# Patient Record
Sex: Male | Born: 1950 | Race: White | Hispanic: No
Health system: Southern US, Community
[De-identification: ages and names within clinical notes are randomized; demographics above are authoritative.]

---

## 2005-11-27 ENCOUNTER — Inpatient Hospital Stay (HOSPITAL_COMMUNITY): Admission: EM | Admit: 2005-11-27 | Discharge: 2005-11-28 | Payer: Self-pay | Admitting: *Deleted

## 2005-11-27 ENCOUNTER — Ambulatory Visit: Payer: Self-pay | Admitting: *Deleted

## 2006-10-30 ENCOUNTER — Ambulatory Visit: Payer: Self-pay | Admitting: Urology

## 2008-05-01 IMAGING — CT CT ABD-PELV W/O CM
1 of 2 series · 15 of 32 positions shown, 19 images · non-contrast
Comparison: none

REASON FOR EXAM: Renal colic, hematuria
COMMENTS:

[Series 2: stone · axial · 0.81mm/px · z∈[+171,+618]mm · 15 of 167 slices shown, 19 images]
[im 12/167  soft-tissue]
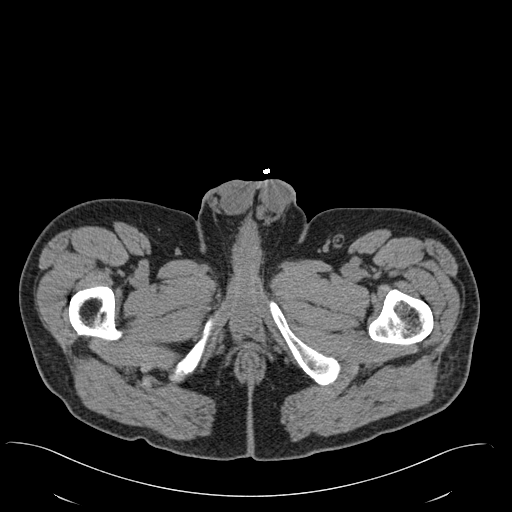
[im 12/167  bone]
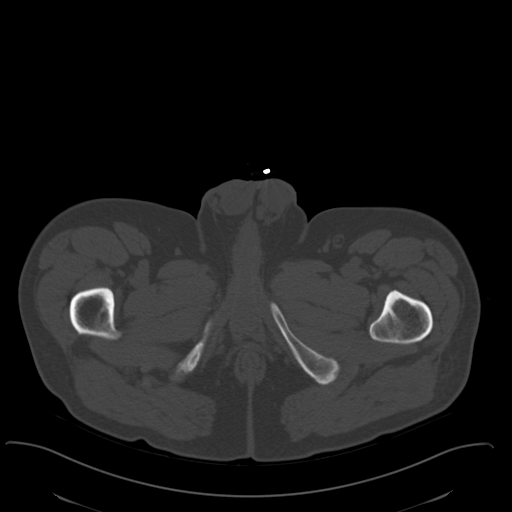
[im 24/167  soft-tissue]
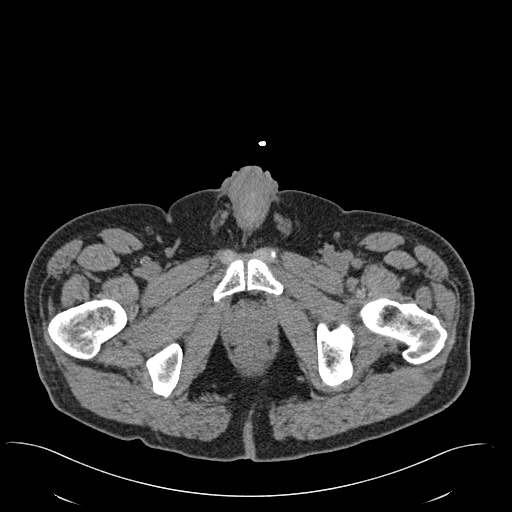
[im 36/167  soft-tissue]
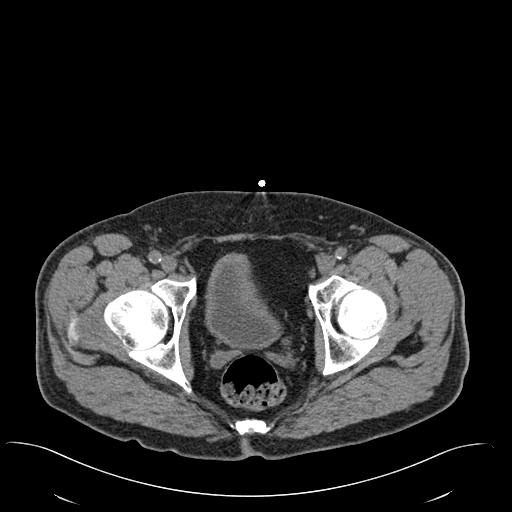
[im 48/167  soft-tissue]
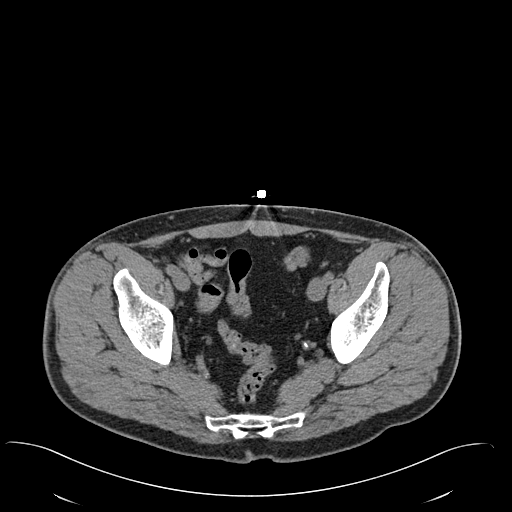
[im 60/167  soft-tissue]
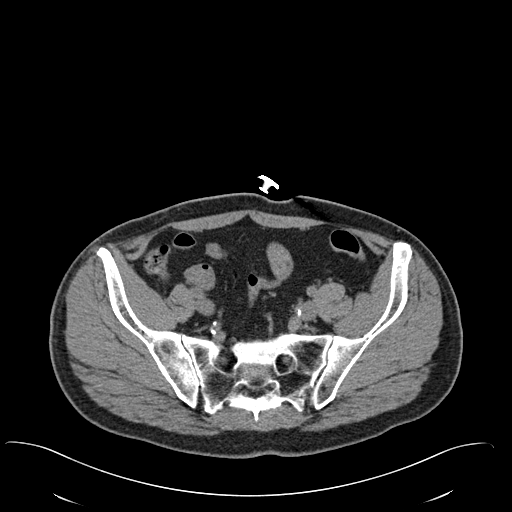
[im 72/167  soft-tissue]
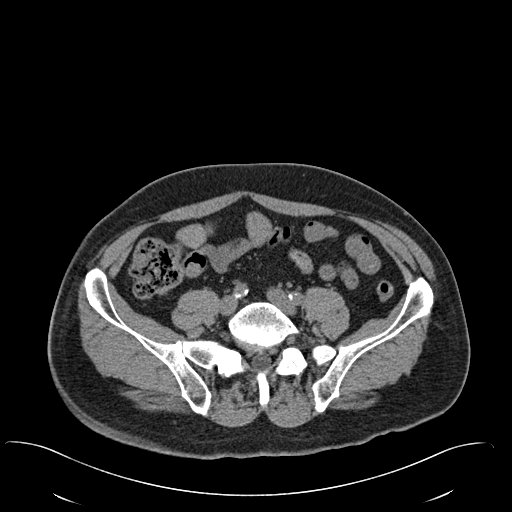
[im 84/167  soft-tissue]
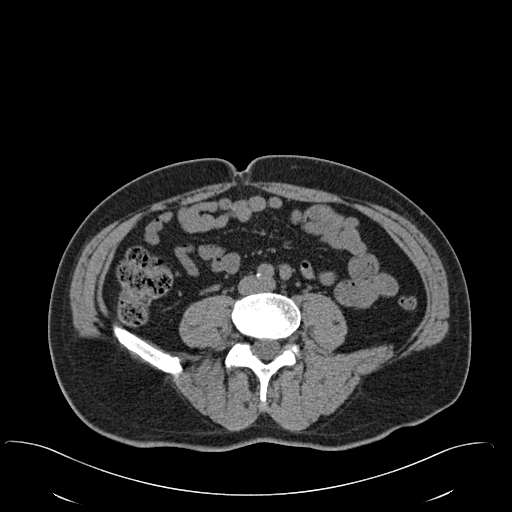
[im 95/167  soft-tissue]
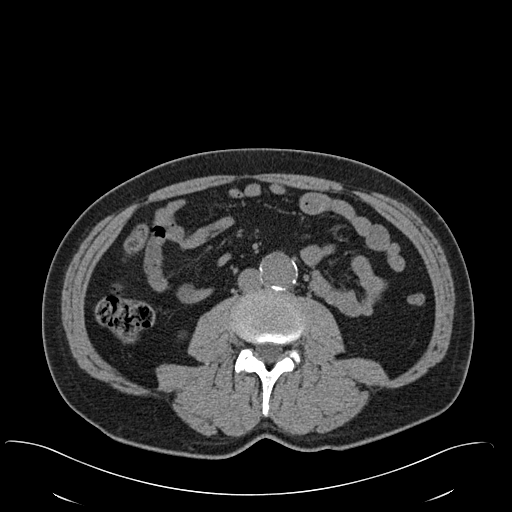
[im 107/167  soft-tissue]
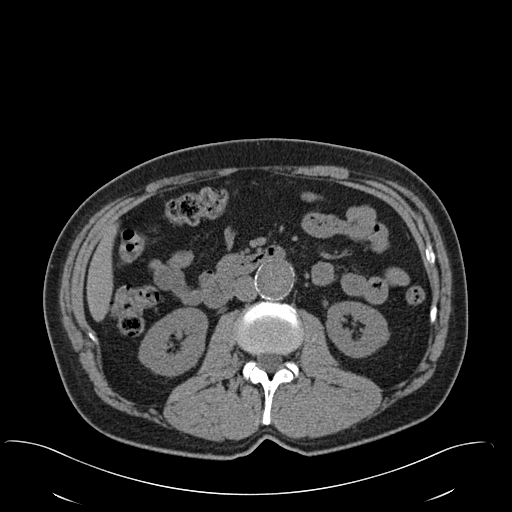
[im 107/167  bone]
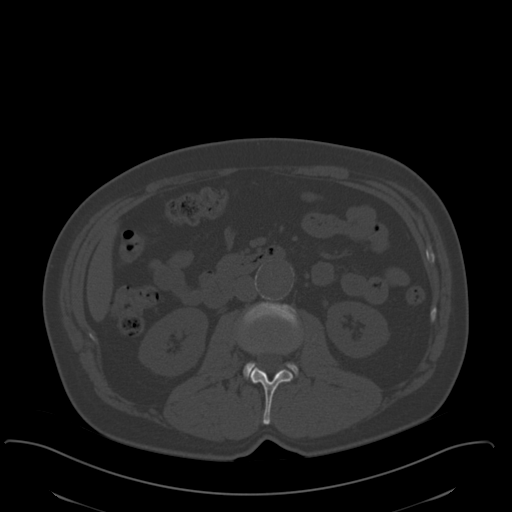
[im 119/167  soft-tissue]
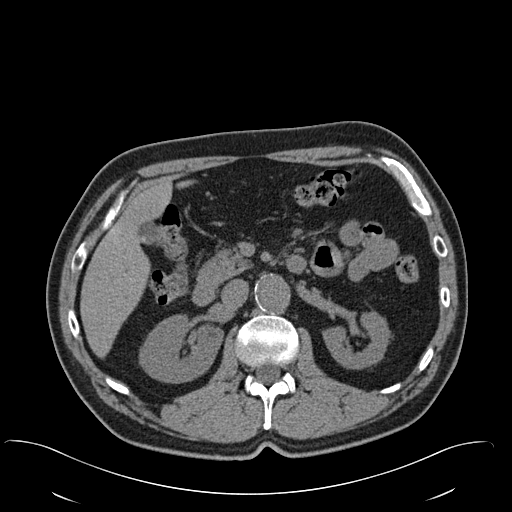
[im 131/167  soft-tissue]
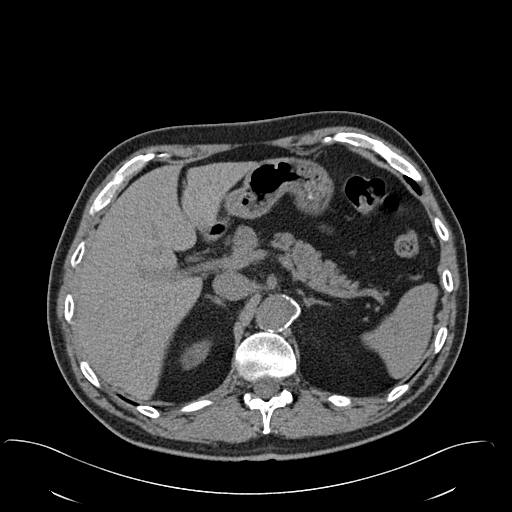
[im 143/167  soft-tissue]
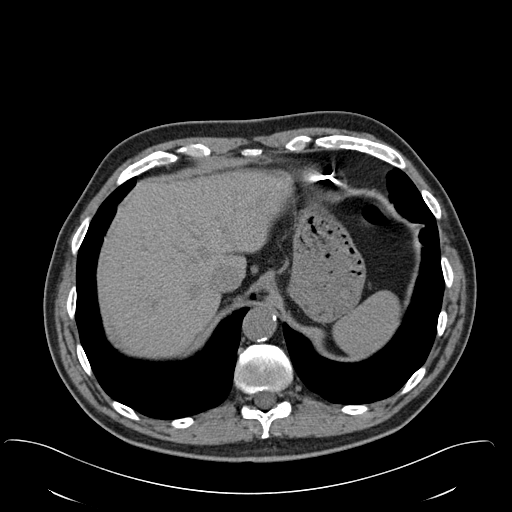
[im 143/167  lung]
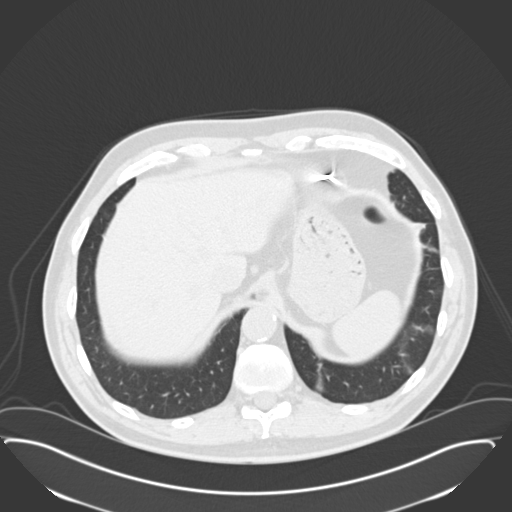
[im 149/167  lung]
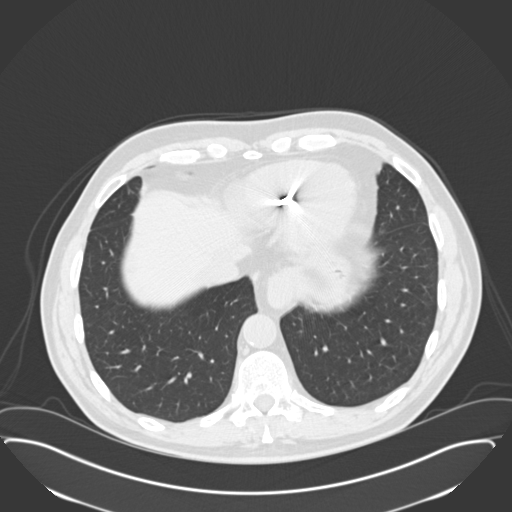
[im 155/167  soft-tissue]
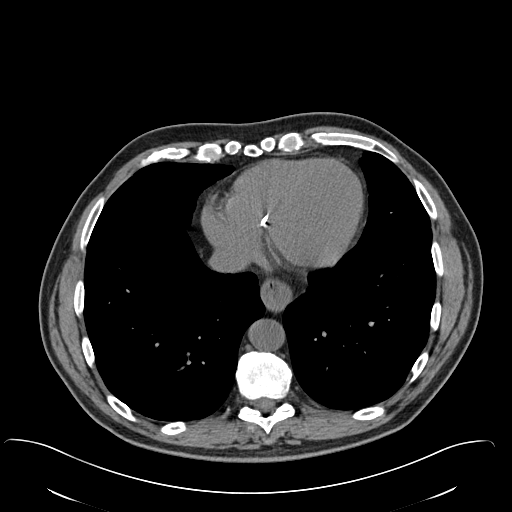
[im 155/167  lung]
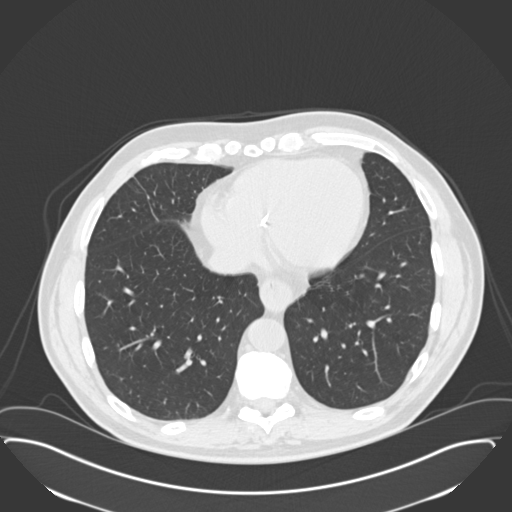
[im 161/167  lung]
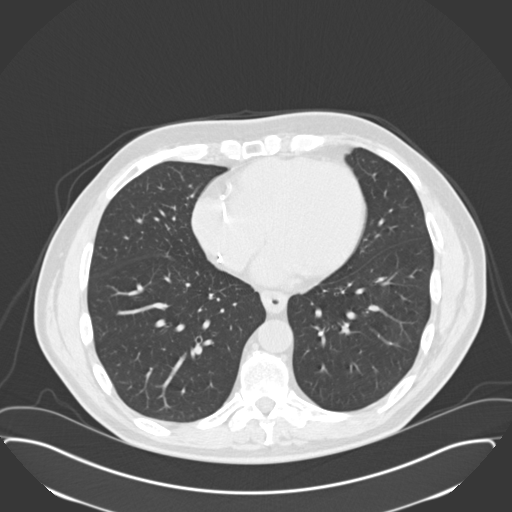

[15 of 32 positions shown; findings below may reference images not displayed]

PROCEDURE:     CT  - CT ABDOMEN AND PELVIS W[DATE]  [DATE]

RESULT:     The study was performed without IV contrast to allow evaluation
of the kidneys for calcified stones.

Neither the RIGHT nor LEFT kidney exhibits evidence of calcified stones or
obstruction. In the anterior aspect of the upper pole of the RIGHT kidney
there is a rounded, 2.9 cm in diameter, hypo density with Hounsfield
measurement of 1 compatible with a benign cyst. The perinephric fat is
normal in appearance. The abdominal aorta demonstrates aneurysmal dilation.
It measures approximately 3.2 cm AP x approximately 3.2 cm transversely.
This extends to the level of the bifurcation but does not involve the common
iliac arteries. The periaortic soft tissues are normal in appearance.

There are gallstones present in the dependent portion of the gallbladder.
The liver, spleen, stomach, pancreas and adrenal glands are normal in
appearance. There is a small, hiatal hernia. The unopacified loops of small
and large bowel exhibit no acute abnormality. There is no evidence of
ascites. The partially distended urinary bladder is grossly normal. There is
sigmoid diverticulosis. The lung bases exhibit no acute abnormality.
IMPRESSION: 1.  There is no evidence of urinary tract obstruction or of calcified
urinary tract stones.
2.  There is a presumed simple cyst involving the upper pole of the RIGHT
kidney. This could be confirmed with ultrasound.
3.  There is an abdominal aortic aneurysm which arises above the origin of
the renal arteries and extends inferiorly to the aortic bifurcation with
subtle narrowing noted at the level of the renal arteries. The maximal
measured diameter is approximately 3.2 cm.
4.  There is sigmoid diverticulosis.

## 2013-10-19 LAB — DRUG SCREEN, URINE

## 2013-10-19 LAB — COMPREHENSIVE METABOLIC PANEL
ALBUMIN: 3.5 g/dL (ref 3.4–5.0)
ALK PHOS: 168 U/L — AB
ALT: 25 U/L
ANION GAP: 10 (ref 7–16)
BUN: 16 mg/dL (ref 7–18)
Bilirubin,Total: 0.5 mg/dL (ref 0.2–1.0)
CALCIUM: 8.7 mg/dL (ref 8.5–10.1)
CHLORIDE: 101 mmol/L (ref 98–107)
Co2: 24 mmol/L (ref 21–32)
Creatinine: 1.45 mg/dL — ABNORMAL HIGH (ref 0.60–1.30)
EGFR (African American): 58 — ABNORMAL LOW
EGFR (Non-African Amer.): 50 — ABNORMAL LOW
Glucose: 100 mg/dL — ABNORMAL HIGH (ref 65–99)
Osmolality: 271 (ref 275–301)
POTASSIUM: 4.1 mmol/L (ref 3.5–5.1)
SGOT(AST): 19 U/L (ref 15–37)
Sodium: 135 mmol/L — ABNORMAL LOW (ref 136–145)
Total Protein: 8.6 g/dL — ABNORMAL HIGH (ref 6.4–8.2)

## 2013-10-19 LAB — CBC
HCT: 44.3 % (ref 40.0–52.0)
HGB: 14.5 g/dL (ref 13.0–18.0)
MCH: 31.4 pg (ref 26.0–34.0)
MCHC: 32.7 g/dL (ref 32.0–36.0)
MCV: 96 fL (ref 80–100)
Platelet: 196 10*3/uL (ref 150–440)
RBC: 4.61 10*6/uL (ref 4.40–5.90)
RDW: 16.3 % — ABNORMAL HIGH (ref 11.5–14.5)
WBC: 10.4 10*3/uL (ref 3.8–10.6)

## 2013-10-19 LAB — URINALYSIS, COMPLETE
BILIRUBIN, UR: NEGATIVE
Glucose,UR: NEGATIVE mg/dL (ref 0–75)
Hyaline Cast: 14
Ketone: NEGATIVE
Leukocyte Esterase: NEGATIVE
Nitrite: NEGATIVE
Ph: 5 (ref 4.5–8.0)
Protein: 30
Specific Gravity: 1.02 (ref 1.003–1.030)
Squamous Epithelial: <1

## 2013-10-19 LAB — ACETAMINOPHEN LEVEL: Acetaminophen: <2 ug/mL

## 2013-10-19 LAB — SALICYLATE LEVEL: Salicylates, Serum: <1.7 mg/dL

## 2013-10-19 LAB — TSH: THYROID STIMULATING HORM: 1.39 u[IU]/mL

## 2013-10-20 ENCOUNTER — Inpatient Hospital Stay: Payer: Self-pay | Admitting: Psychiatry

## 2013-10-22 LAB — LIPID PANEL
Cholesterol: 111 mg/dL (ref 0–200)
HDL Cholesterol: 30 mg/dL — ABNORMAL LOW (ref 40–60)
Ldl Cholesterol, Calc: 61 mg/dL (ref 0–100)
TRIGLYCERIDES: 100 mg/dL (ref 0–200)
VLDL Cholesterol, Calc: 20 mg/dL (ref 5–40)

## 2013-10-22 LAB — HEMOGLOBIN A1C: HEMOGLOBIN A1C: 6.1 % (ref 4.2–6.3)

## 2013-10-23 LAB — COMPREHENSIVE METABOLIC PANEL
ANION GAP: 7 (ref 7–16)
Albumin: 3 g/dL — ABNORMAL LOW (ref 3.4–5.0)
Alkaline Phosphatase: 126 U/L — ABNORMAL HIGH
BILIRUBIN TOTAL: 0.5 mg/dL (ref 0.2–1.0)
BUN: 17 mg/dL (ref 7–18)
Calcium, Total: 8.6 mg/dL (ref 8.5–10.1)
Chloride: 103 mmol/L (ref 98–107)
Co2: 29 mmol/L (ref 21–32)
Creatinine: 1.61 mg/dL — ABNORMAL HIGH (ref 0.60–1.30)
EGFR (African American): 51 — ABNORMAL LOW
GFR CALC NON AF AMER: 44 — AB
Glucose: 95 mg/dL (ref 65–99)
Osmolality: 279 (ref 275–301)
Potassium: 4 mmol/L (ref 3.5–5.1)
SGOT(AST): 14 U/L — ABNORMAL LOW (ref 15–37)
SGPT (ALT): 21 U/L
Sodium: 139 mmol/L (ref 136–145)
Total Protein: 6.9 g/dL (ref 6.4–8.2)

## 2013-10-24 LAB — CBC WITH DIFFERENTIAL/PLATELET
Basophil #: 0.1 10*3/uL (ref 0.0–0.1)
Basophil %: 0.7 %
Eosinophil #: 0.1 10*3/uL (ref 0.0–0.7)
Eosinophil %: 1.7 %
HCT: 38.2 % — AB (ref 40.0–52.0)
HGB: 12.4 g/dL — AB (ref 13.0–18.0)
LYMPHS ABS: 2.2 10*3/uL (ref 1.0–3.6)
Lymphocyte %: 25.8 %
MCH: 31.3 pg (ref 26.0–34.0)
MCHC: 32.4 g/dL (ref 32.0–36.0)
MCV: 96 fL (ref 80–100)
MONO ABS: 0.4 x10 3/mm (ref 0.2–1.0)
MONOS PCT: 4.3 %
NEUTROS PCT: 67.5 %
Neutrophil #: 5.7 10*3/uL (ref 1.4–6.5)
Platelet: 140 10*3/uL — ABNORMAL LOW (ref 150–440)
RBC: 3.96 10*6/uL — ABNORMAL LOW (ref 4.40–5.90)
RDW: 16.8 % — AB (ref 11.5–14.5)
WBC: 8.5 10*3/uL (ref 3.8–10.6)

## 2013-10-24 LAB — BASIC METABOLIC PANEL
Anion Gap: 10 (ref 7–16)
BUN: 20 mg/dL — ABNORMAL HIGH (ref 7–18)
CHLORIDE: 103 mmol/L (ref 98–107)
CO2: 27 mmol/L (ref 21–32)
Calcium, Total: 8.6 mg/dL (ref 8.5–10.1)
Creatinine: 1.81 mg/dL — ABNORMAL HIGH (ref 0.60–1.30)
GFR CALC AF AMER: 44 — AB
GFR CALC NON AF AMER: 38 — AB
GLUCOSE: 178 mg/dL — AB (ref 65–99)
Osmolality: 286 (ref 275–301)
POTASSIUM: 3.6 mmol/L (ref 3.5–5.1)
SODIUM: 140 mmol/L (ref 136–145)

## 2013-10-24 LAB — CK: CK, Total: 47 U/L

## 2013-10-24 LAB — TROPONIN I: Troponin-I: <0.02 ng/mL

## 2013-10-24 LAB — PHOSPHORUS: Phosphorus: 2.1 mg/dL — ABNORMAL LOW (ref 2.5–4.9)

## 2013-10-24 LAB — CK-MB: CK-MB: 1 ng/mL (ref 0.5–3.6)

## 2013-10-24 LAB — MAGNESIUM: Magnesium: 2.1 mg/dL

## 2013-10-25 LAB — BASIC METABOLIC PANEL
ANION GAP: 7 (ref 7–16)
BUN: 19 mg/dL — AB (ref 7–18)
Calcium, Total: 8.5 mg/dL (ref 8.5–10.1)
Chloride: 104 mmol/L (ref 98–107)
Co2: 29 mmol/L (ref 21–32)
Creatinine: 1.69 mg/dL — ABNORMAL HIGH (ref 0.60–1.30)
EGFR (African American): 48 — ABNORMAL LOW
EGFR (Non-African Amer.): 42 — ABNORMAL LOW
GLUCOSE: 81 mg/dL (ref 65–99)
OSMOLALITY: 281 (ref 275–301)
Potassium: 4.1 mmol/L (ref 3.5–5.1)
Sodium: 140 mmol/L (ref 136–145)

## 2013-10-29 LAB — PROT IMMUNOELECTROPHORES(ARMC)

## 2014-07-09 NOTE — Consult Note (Signed)
PATIENT NAME:  Jerem[facility], PEREGOY MR#:  [id number] DATE OF BIRTH:  1950-10-13  DATE OF CONSULTATION:  10/24/2013  REFERRING PHYSICIAN:  Vania Rea K. Nicolasa Ducking, [place]:  Wilford Corner. Jannifer Franklin, MD.  REASON FOR CONSULT:  Chest pain and heart palpitations.   HISTORY OF PRESENT ILLNESS:  Jeremy [facility] is a 64 year old male admitted to the behavioral health unit.  He has a longstanding history of anxiety from which is he actually disabled.  He is very anxious and unable to tolerate crowds including leaving his house for any extended period of time or being around any significant number of people.  He has had increased anxiety and depression for the last year since his wife passed away.  He was admitted to the behavioral health unit for treatment for his depression and suicidal ideations.  He has a pertinent past medical history otherwise including abdominal aortic aneurysm, coronary artery disease status post myocardial infarction, congestive heart failure status post defibrillator implantation, benign prostatic hypertrophy, gallstones, GERD, dyslipidemia.   A consult was called to the hospitalist service after the patient complained of chest pain and palpitations last night.  Upon interview the patient states that he feels like he gets these spells of palpitations with some very mild chest pain after he takes certain medication, fluvoxamine.  The patient feels like these episodes are not severe, they are not associated with diaphoresis.  The chest pain does not radiate, it is not squeezing in nature.  It is more of a left-sided dull pain.  The patient states it is not similar to his prior pain when he had his myocardial infarction. It does not radiate, is not associated with nausea and vomiting. The patient does say that he feels like it has happened a number of times after taking this new medicine.  This medicine, fluvoxamine, was started after his admission here as an inpatient. Of note, the patient  also has a history of an abdominal aortic aneurysm reportedly measuring 5.5 cm.  He says that the last time it was imaged was back in April, he thinks.  He was scheduled to have repeat testing done toward the middle of this month, August 2015, with potential planned surgery toward the end of this month, August 2015.  He does complain of some chronic lower back pain and he also was noted to have a little bit of microscopic hematuria, which he says has been there for quite some time and has been partially worked-up by his outpatient physician. He does not complain of any abdominal pain or any other symptoms at this time.    PAST MEDICAL HISTORY:  Abdominal aortic aneurysm, coronary artery disease, systolic congestive heart failure, benign prostatic hypertrophy, gallstones, GERD, dyslipidemia, depression, anxiety.   FAMILY HISTORY:  The patient states that anxiety runs in his family. Two of his sisters also have this condition.   SOCIAL HISTORY:  The patient is a widower, he is disabled due to his anxiety.  He lives with his son.  Current smoker.    CURRENT MEDICATIONS: Aspirin 325 mg daily, Lipitor 80 mg daily, finasteride 5 mg daily, Lasix 40 mg b.i.d., meclizine 25 mg t.i.d. p.r.n. dizziness, Toprol XL 100 mg daily, pantoprazole 40 mg b.i.d., Tylenol 650 mg q. 4 hours p.r.n. pain and headache, milk of magnesia 30 mL at bedtime p.r.n. constipation, nicotine inhaler q. 1 hour p.r.n. cravings, diazepam 5 mg q. 8 hours, risperidone 1 mg b.i.d., alprazolam 0.5 mg q. 6 hours p.r.n., citalopram 20 mg daily, fluvoxamine  100 mg daily at bedtime.   ALLERGIES:  PAXIL.   REVIEW OF SYSTEMS:  CONSTITUTIONAL:  No fevers or chills, no weight changes.  EYES:  No double vision, no blurred vision, no pain.  ENT:  No hearing loss, no tinnitus. No ear pain.  RESPIRATORY:  No shortness of breath, no cough.  CARDIOVASCULAR:  One episode of mild chest pain, recurrent episodes of palpitations. No orthopnea. No dyspnea on  exertion. No lower extremity edema.  GASTROINTESTINAL:  No Abdominal pain, no nausea or vomiting or diarrhea.  GENITOURINARY:  No incontinence.  No frequency. Mild hesitancy which is chronic.  No gross hematuria.  ENDOCRINE:  No heat or cold intolerance.  MUSCULOSKELETAL:  No muscle or joint pains.   SKIN:  No acne, no rashes, no skin lesions.  NEUROLOGICAL: No focal weakness, no tingling, no numbness.    PHYSICAL EXAMINATION:  VITAL SIGNS:  Temperature 98.4, pulse 85, respirations 18, blood pressure 102/68.  GENERAL:  This is a well-developed pleasant elderly gentleman in no acute distress this morning.  HEENT:  Pupils equal, round and reactive to light, extraocular movements intact.  NECK:  Supple. No cervical lymphadenopathy.  LUNGS:  Clear to auscultation bilaterally. No rhonci, wheezes or rales.  HEART:  Regular rate, no murmurs, rubs or gallops. ABDOMEN:  Soft, nontender, nondistended. Positive bowel sounds. No bruit auscultated.  MUSCULOSKELETAL:  Full range of motion and spontaneous movement in all extremities. No deformities.  SKIN:  No rashes, no bruises.  No lesions.  NEUROLOGICAL: Cranial nerves II-XII grossly intact. No acute focal sensory or motor deficit.  PSYCHIATRIC:  Somewhat blunted affect but otherwise appropriate.   LABORATORY DATA:  White count 8.5, hemoglobin 12.4, hematocrit 38.2, platelet count 140,000, sodium 140, potassium 3.6, chloride 103, bicarbonate 27, anion gap 10, glucose 178, creatinine 1.81, BUN 20, calcium 8.6, troponin less than 0.02.   EKG performed and pending read at this time.   ASSESSMENT AND PLAN: This is a 64 year old gentleman with past medical history listed above, admitted to the behavioral health unit.  Our service was consulted for workup of chest pain and palpitations.   1.  Chest pain.  This patient has a history of coronary artery disease, heart attack in the past with stent placement, congestive heart failure status post defibrillator  placement. His chest pain at this time and palpitations he feels are related to a new medication, fluvoxamine; however, given his significant cardiac history EKG has been ordered, troponins have been ordered and are negative to date. We will continue to trend these for 3 negative sets and follow up his EKG as soon as it has been performed. Given his lack of typical cardiac symptoms and the fact that he does not feel that this pain is similar to when he had his heart attack in the past, I feel it is less likely that there is true cardiac nature to his chest pain. It is possible that he has some element of anxiety provoking his palpitations, as well as the possibility of this new medication, fluvoxamine, which has a 3% incidence of chest pain, 3% incidence of palpitations and 5-8% incidence of increased anxiety as side effects listed on the drug insert.  2.  Back pain. The patient states that this back pain is low back pain, it is chronic.  He says it is exacerbated by the bed that he is sleeping on at this time in the hospital, however, the pain has been there for some time.  He also has a  history of an abdominal aortic aneurysm which is being worked up at this time; however, the pain in his back does not seem to radiate from his belly and he denies abdominal pain.  It is possible that this back pain is something stemming from his abdominal aortic aneurysm.  See next problem for treatment of the abdominal aortic aneurysm. It is also possible that this back pain in conjunction with his microscopic hematuria could stem from something else.  The differential could include something like multiple myeloma; see workup of problem hematuria for this plan.  3.  Abdominal aortic aneurysm. The patient has a history of abdominal aortic aneurysm reportedly at 5.5 cm.  He supposedly is to be worked up for potential procedure for this later this month.  Given his back pain at this time we will perform retroperitoneal ultrasound  to measure his abdominal aortic aneurysm to be sure that it has not increased in size.  4.  Hematuria.  This is microscopic hematuria.  The patient states that he has had this hematuria for some time and has had partial workup by his outpatient primary care physician including a course of antibiotics for possible urinary tract infection or possible prostatitis. Given his concurrent back pain, hematuria and his mild decrease in renal function we will work him up for potential multiple myeloma at this time including SPEP and UPEP for screening; may consider LDH, CRP, beta2 microglobulin and serum free light chains in the future if felt to be warranted.   Time spent:  45 minutes.    ____________________________ [place]-F. Jannifer Franklin, MD-dfw:ltD: 10/24/2013 12:22:53 ET T: 10/24/2013 13:00:48 ET JOB#: [id number]  cc: Wilford Corner. Jannifer Franklin, MD, <Dictator> Eleasha Cataldo Fawn Kirk MD ELECTRONICALLY SIGNED 11/23/2013 9:43

## 2014-07-09 NOTE — Consult Note (Signed)
Brief Consult Note: Diagnosis: major depression with psychosis.   Patient was seen by consultant.   Consult note dictated.   Recommend further assessment or treatment.   Orders entered.   Comments: Psychiatry: PAtient seen. Chart reviewed. Note dictated. Admit to inpt BH on IVC dx MDE severe with psychotic features. Continue outpt meds for now.  Electronic Signatures: Audery Amellapacs, Lesieli Bresee T (MD)  (Signed 05-Aug-15 10:00)  Authored: Brief Consult Note   Last Updated: 05-Aug-15 10:00 by Audery Amellapacs, Kla Bily T (MD)

## 2014-07-09 NOTE — H&P (Signed)
PATIENT NAME:  Jeremy Aguilar, Jeremy Aguilar MR#:  [id number] DATE OF BIRTH:  August 16, 1950  DATE OF ADMISSION:  10/20/2013  DATE OF EVALUATION:  10/21/2013.  REFERRING PHYSICIAN: Emergency Room MD.   ATTENDING PHYSICIAN: [facility], M.D.   IDENTIFYING DATA: Mr. Jeremy [facility]ilar is a 64 year old male with history of depression and anxiety.   CHIEF COMPLAINT: "I wanted to cut myself."   HISTORY OF PRESENT ILLNESS: Mr. Jeremy [facility]ilar has a long history of anxiety and is disabled from it. He is very nervous in social situations. He is unable to tolerate any crowds, unable to go to the mall. He is able to leave the house to go to his small nearby store to buy a few items, other than that he is always accompanied by his family. Even then he sometimes freezes and is unable to get out of the car to go, for example, to his appointments. His wife passed away 1 year ago and his anxiety and depression have gotten worse in spite of treatment with citalopram and  Xanax prescribed by his primary care provider.  He has new stressors. He was diagnosed with an aortic aneurysm that is growing bigger and surgery was recommended.  The patient has some tests to undergo in the middle of August and then surgery for aneurysm is scheduled at the end of August.   He became increasingly worried, depressed and eventually suicidal. For the past 2 weeks he has been thinking of cutting himself and the family had to remove his knife from the house. On the day of admission he went with his family to the initial appointment at [facility] to seek help.  He brought a razor blade with him and disclosed to the counselor that he is unable to press on and is going to cut himself. He was brought to the Emergency Room. The patient also reports psychotic symptoms for the past 2 months. He believes there are people under his house, that they have installed cameras, that the cameras took indecent pictures of him that are now posted online. He is has been unable  to function lately in spite of support from his family. He denies alcohol or illicit substance use. He denies symptoms suggestive of bipolar illness.   PAST PSYCHIATRIC HISTORY: He has never been hospitalized. There is no history of cutting or suicide attempts. No problems with substances. He has been treated with citalopram and Xanax by his primary physician. He went to therapy a while ago but was discouraged by comments made by a therapist. He sought appointment with [facility] at the urging of his family.   FAMILY PSYCHIATRIC HISTORY: Two of his sisters have anxiety, one is treated with Zoloft.   PAST MEDICAL HISTORY: Aortic aneurysm, benign prostatic hypertrophy, gallstones, dyslipidemia, hypertension, GERD.  ALLERGIES: PAXIL.   MEDICATIONS ON ADMISSION: Celexa 20 mg daily, Xanax 0.5 mg every 6 hours, aspirin 325 mg daily, atorvastatin 80 mg at bedtime, Proscar 5 mg daily, Lasix 40 mg twice daily, meclizine 25 mg 3 times daily as needed for nausea, metoprolol ER 100 mg daily, pantoprazole 40 mg twice daily.   SOCIAL HISTORY: He is widower, disabled from anxiety. He lives with his son in [place]. He has a supportive family, he has insurance.   REVIEW OF SYSTEMS:  CONSTITUTIONAL: No fevers or chills. No weight changes.  EYES: No double or blurred vision.  ENT: No hearing loss.  RESPIRATORY: No shortness of breath or cough.  CARDIOVASCULAR: No chest pain or orthopnea.  GASTROINTESTINAL: No abdominal pain,  nausea, vomiting, or diarrhea.  GENITOURINARY: No incontinence or frequency.  ENDOCRINE: No heat or cold intolerance.  LYMPHATIC: No anemia or easy bruising.  INTEGUMENTARY: No acne or rash.  MUSCULOSKELETAL: No muscle or joint pain.  NEUROLOGIC: No tingling or weakness.  PSYCHIATRIC: See history of present illness for details.   PHYSICAL EXAMINATION:  VITAL SIGNS: Blood pressure 123/70, pulse 74, respirations 20, temperature 98.  GENERAL: This is a well-developed elderly gentleman in  no acute distress.  HEENT: The pupils are equal, round, and reactive to light. Sclerae are anicteric.  NECK: Supple. No thyromegaly. LUNGS: Clear to auscultation. No dullness to percussion.  HEART: Regular rhythm and rate. No murmurs, rubs, or gallops.  ABDOMEN: Soft, nontender, nondistended. Positive bowel sounds.  MUSCULOSKELETAL: Normal muscle strength in all extremities.  SKIN: No rashes or bruises.  LYMPHATIC: No cervical adenopathy.  NEUROLOGIC: Cranial nerves II through XII are intact.   LABORATORY DATA: Chemistries: Blood glucose 100, BUN 16, creatinine 1.45, sodium 135, potassium 4.1, blood alcohol level not done. LFTs within normal limits with alkaline phosphatase 168. TSH 1.39. CBC within normal limits. Serum acetaminophen and salicylate are low.   Urine toxicology screen positive for benzodiazepines. Urinalysis is not suggestive of urinary tract infection.   MENTAL STATUS EXAMINATION ON ADMISSION: The patient is alert and oriented to person, place, time and situation. He is pleasant, polite and cooperative. He is meticulously groomed and casually dressed. He maintains good eye contact. His speech is of normal rhythm, rate and volume. Mood is depressed with full affect. Thought process is logical and goal oriented. Thought content: He denies thoughts of hurting himself or others in the hospital, but was admitted after threatening to kill himself with a razor blade. He is paranoid and delusional. He denies auditory or visual hallucinations. His cognition is grossly intact. Registration, recall, short and long-term memory seem okay. He is of average intelligence and fund of knowledge. His insight and judgment are poor.   SUICIDE RISK ASSESSMENT ON ADMISSION: This is a patient with a history of severe anxiety, depression and new onset psychotic symptoms who came to the hospital threatening suicide. He is at increased risk of violence.   INITIAL DIAGNOSES:  AXIS I: Major depressive  disorder, recurrent, severe with psychotic features; social anxiety disorder.  AXIS II: Personality disorder, not otherwise specified.  AXIS IV: Hypertension, dyslipidemia, gastroesophageal reflux disease, aortic aneurysm and gallstones, benign prostatic hypertrophy.   AXIS IV: Mental illness, recent loss, worsening of general health.  AXIS V: GAF: 25.   PLAN: The patient was admitted to [facility] unit for safety, stabilization and medication management. He was initially placed on suicide precautions and was closely monitored for any unsafe behaviors. He underwent full psychiatric and risk assessment. He received pharmacotherapy, individual and group psychotherapy, substance abuse counseling, and support from therapeutic milieu.  1.  Suicidal ideation. He is able to contract for safety.  2.  Mood and anxiety. We will switch Celexa to Luvox to address obsessive-compulsive-type of symptoms and titrate his dose to 150 mg if tolerated. We will discontinue Xanax and start Valium for anxiety. Our goal is to prepare the patient to face surgery in a couple of weeks. He needs an antipsychotic for psychotic symptoms, probably Abilify for now. He may also be a candidate for Seroquel.   MEDICAL: We will continue all medicines as prescribed by his primary care provider in the community.   DISPOSITION: He will be returning to home and follow up  with [facility].    ____________________________ Ellin GoodieJolanta B. Jennet MaduroPucilowska, MD jbp:lt D: 10/21/2013 11:28:04 ET T: 10/21/2013 12:33:45 ET JOB#: [id number]  cc: Kallen Delatorre B. Jennet MaduroPucilowska, MD, <Dictator> Shari ProwsJOLANTA B Veronica Guerrant MD ELECTRONICALLY SIGNED 10/21/2013 23:34

## 2014-07-09 NOTE — Consult Note (Signed)
PATIENT NAME:  Jeremy Aguilar, Jeremy Aguilar MR#:  [id number] DATE OF BIRTH:  12/24/1950  DATE OF CONSULTATION:  10/20/2013  CONSULTING PHYSICIAN:  Jeremy AmelJohn T. Manisha Cancel, MD  IDENTIFYING INFORMATION AND CHIEF COMPLAINT:  A 64 year old man with a history of anxiety disorder who was brought under involuntary commitment from [facility].   CHIEF COMPLAINT: "I just got an anxiety problem."   HISTORY OF PRESENT ILLNESS: Information obtained from the patient and the chart. The patient reports he has had long-standing anxiety problem going back most of his life, but since he was about age 64, when he had a nervous breakdown, it has been constant. He has a great deal of trouble being around other people and a great deal of trouble being seen in public. However, he does not go to mental health very often. Recently, he has been feeling more anxious, down, negative, and depressed, has chronic trouble sleeping. He has been feeling more negative about himself, and started having suicidal thoughts with thoughts about cutting himself; although, he has not acted on it. He started to have hallucinations, hearing bumping sounds under the house, and started to be paranoid, believing that there were cameras inside the house spying on him. His family got concerned and took him to [facility]. When he went there, they found that he had brought a razor with him and he says he was planning to end his own life. He was brought here on involuntary commitment. He does take citalopram at home, but otherwise does not normally engage in treatment.   PAST PSYCHIATRIC HISTORY: Claims he has never had psychiatric hospitalization before. Claims no history of suicide attempts in the past. Reports a long-standing problem with anxiety, what sounds like social anxiety. Says he has been on multiple medications including Paxil, which made him pass out, as well as Zoloft. He cannot remember the names of any others. So far, he is tolerating the citalopram.   SOCIAL  HISTORY: Lives with his adult son. Does not work. Gets disability. His wife died about a year ago, which has been an "extra big stress" on him.   FAMILY HISTORY: Does not know of family history of mental illness.   PAST MEDICAL HISTORY: He thinks he has a lot of severe medical problems. It sounds like he probably has just a hiatal hernia and gastric reflux symptoms. Recently developed some prostate problems and is taking Proscar. No known history of heart attack or stroke or seizures.   CURRENT MEDICATIONS: Atorvastatin 80 mg at night, citalopram 20 mg a day, Proscar 5 mg daily, Lasix 40 mg twice a day, Antivert p.r.n. for nausea, metoprolol extended-release 100 mg per day, pantoprazole 40 mg b.i.d.   ALLERGIES: PAXIL, BUT THAT IS JUST A SIDE EFFECT.   SUBSTANCE ABUSE HISTORY: Says he used to drink when he was younger, but has not had a problem with drinking in a long time. Admits that he sometimes takes more of his Xanax than prescribed intermittently. Does not think it is an addiction problem. Denies other substance abuse.   REVIEW OF SYSTEMS: Feeling anxious and panicky. Denies acute suicidal intent, but still has some passive suicidal ideation, not having hallucinations here. No specific physical complaints except that his back hurts from "being on this hard bed all night".   MENTAL STATUS EXAMINATION: Adequately groomed gentleman, looks his stated age, cooperative with the interview. Eye contact: Good. Psychomotor activity: Limited. Speech: Normal rate, tone, and volume. Affect: Constricted, dysphoric. Mood stated as "being nervous". Thoughts are lucid without loosening  of associations or delusions. Denies auditory or visual hallucinations. Denies acute suicidal intent, but had recent suicidal plans and still has some passive suicidal ideation. No homicidal ideation. Alert and oriented x 4. Memory is 2/3 at three minutes. Long-term memory intact. Judgment and insight somewhat impaired recently.    LABORATORY RESULTS: CBC is all normal. Chemistry panel shows a drug screen positive just for benzodiazepines. TSH normal at 1.39. Alkaline phosphatase elevated at 168, creatinine elevated at 1.45, sodium low 135.   URINALYSIS: Shows 3+ blood, positive red cells, questionable whether he could have an infection.   ASSESSMENT: A 64 year old man who sounds like he is having a major depression with psychotic features on top of long-standing anxiety disorder, social anxiety, and possibly other anxiety problems as well. Needs hospitalization because of recent serious suicidal ideation, psychosis, and poor functioning.   TREATMENT PLAN: Admit to psychiatry. For now, continue current medications. Primary treatment team can evaluate and discuss further changes to his medication once he gets downstairs. Psychoeducation and supportive therapy done in the Emergency Room.   DIAGNOSIS, PRINCIPAL AND PRIMARY:  AXIS I: Major depression, severe, single with psychotic features.  SECONDARY DIAGNOSES: AXIS I: Social anxiety disorder. AXIS II: Deferred.  AXIS III: History of elevated cholesterol, high blood pressure, hiatal hernia, gastric reflux symptoms, enlarged prostate.  AXIS IV: Severe from lack of social stress, recent death of his wife.  AXIS V: Functioning at time of evaluation is 30.    ____________________________ Jeremy Amel, MD jtc:nb D: 10/20/2013 10:59:31 ET T: 10/20/2013 11:37:06 ET JOB#: [id number]  cc: Jeremy Amel, MD, <Dictator> Jeremy Amel MD ELECTRONICALLY SIGNED 11/26/2013 16:54

## 2015-04-27 IMAGING — US ULTRASOUND RETROPERITONEAL COMPLETE
1 series · 14 of 25 positions shown · non-contrast
Comparison: CT scan dated 10/30/2006

CLINICAL DATA: Abdominal aortic aneurysm.

EXAM:
RETROPERITONEAL ULTRASOUND COMPLETE
TECHNIQUE: Ultrasound examination of the abdominal aorta was performed to
evaluate for abdominal aortic aneurysm. The common iliac arteries,
IVC, and kidneys were also evaluated.

[Series 1: ultrasound retroperitoneal complete · 0.35mm/px · 14 of 48 slices shown]
[im 1/48]
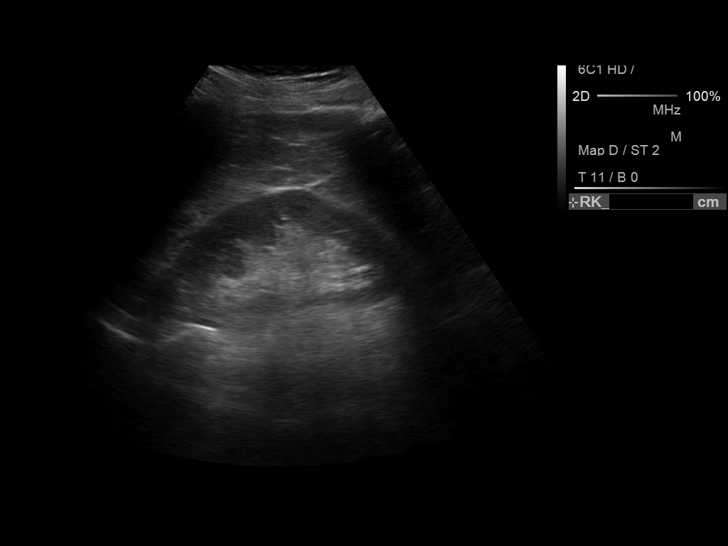
[im 4/48]
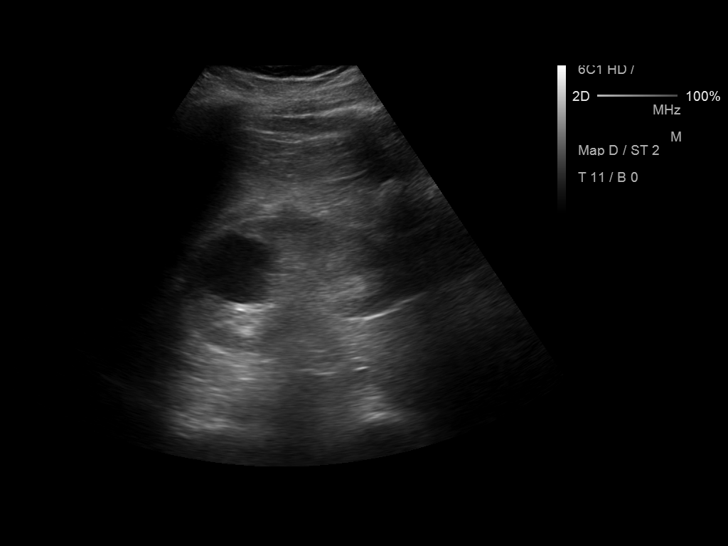
[im 8/48]
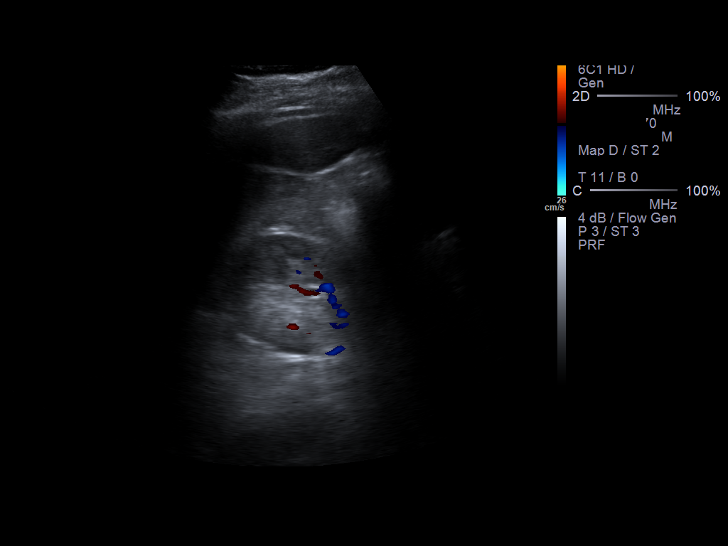
[im 12/48]
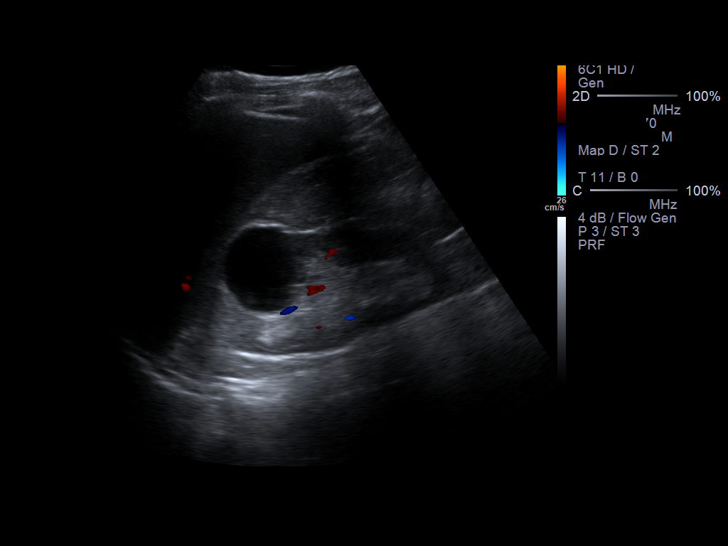
[im 16/48]
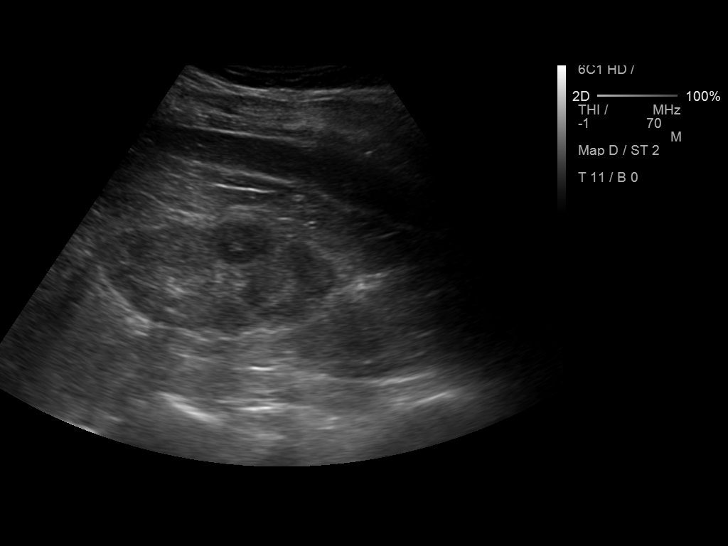
[im 18/48]
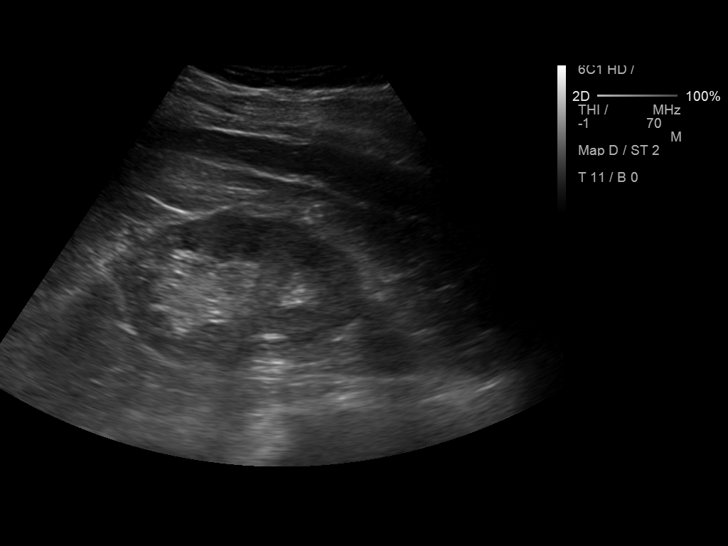
[im 22/48]
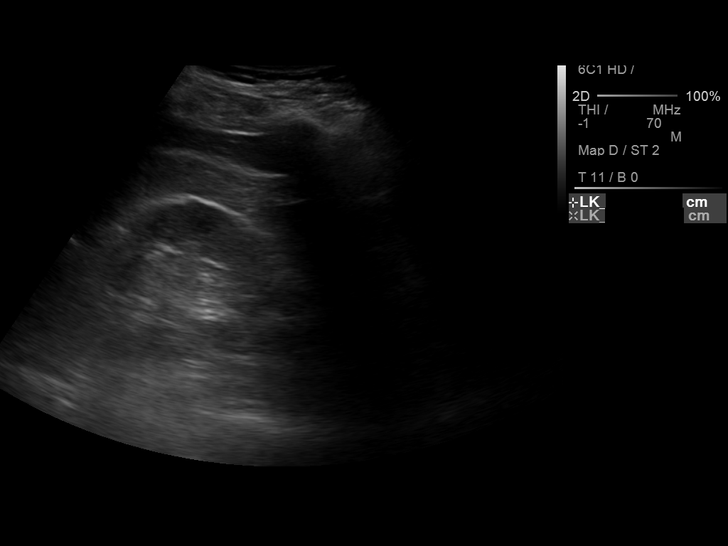
[im 26/48]
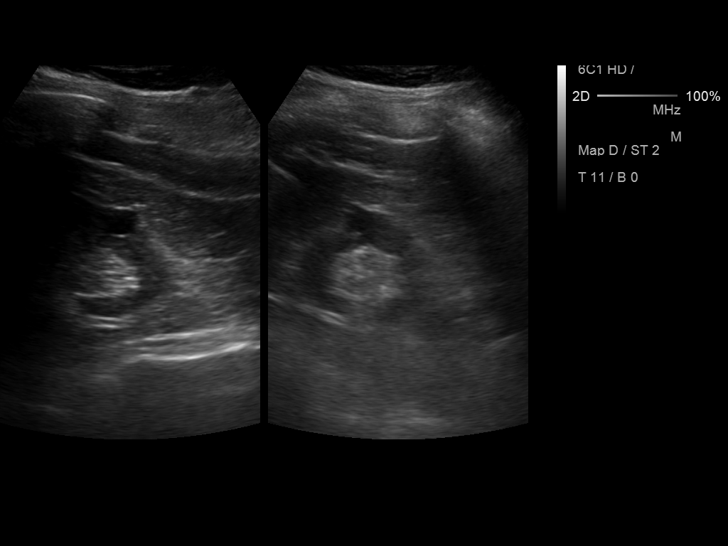
[im 30/48]
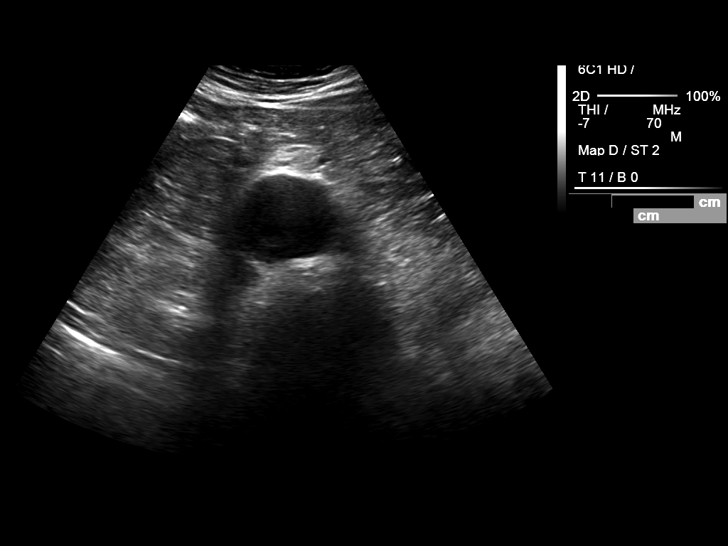
[im 32/48]
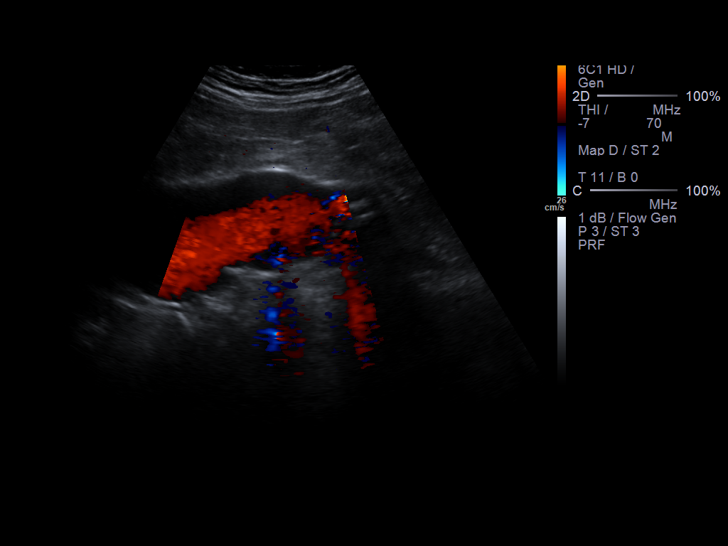
[im 36/48]
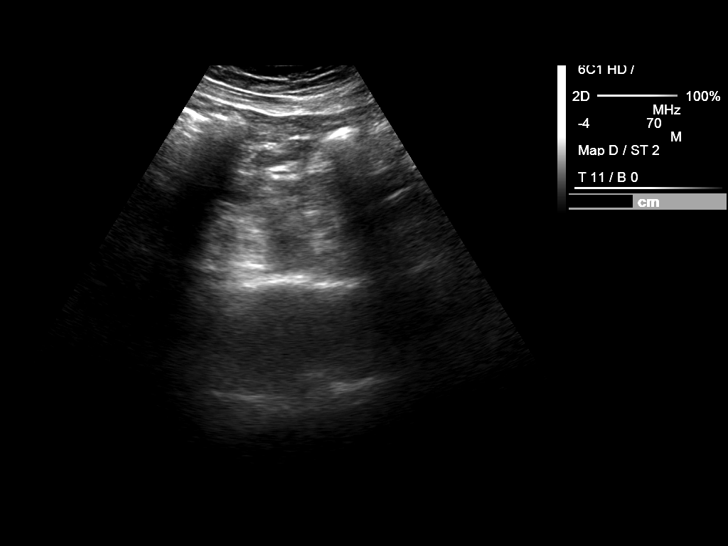
[im 40/48]
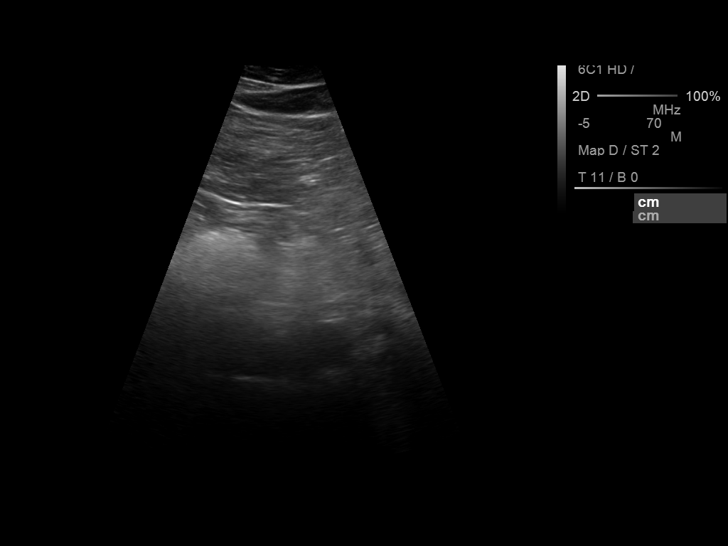
[im 44/48]
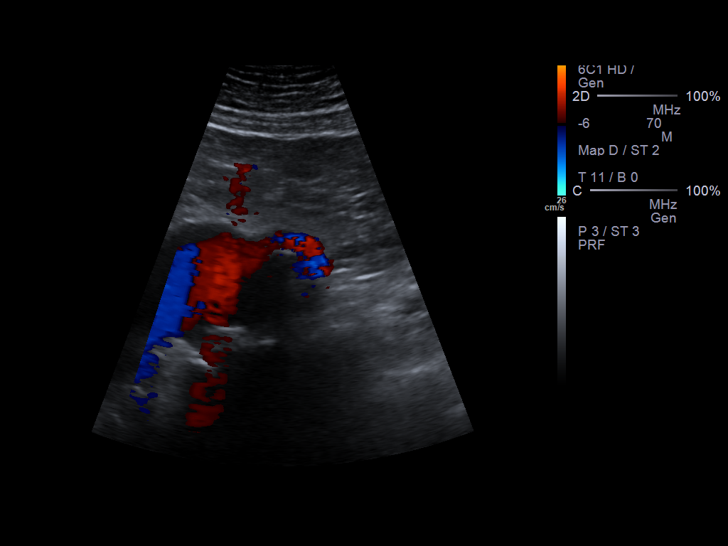
[im 48/48]
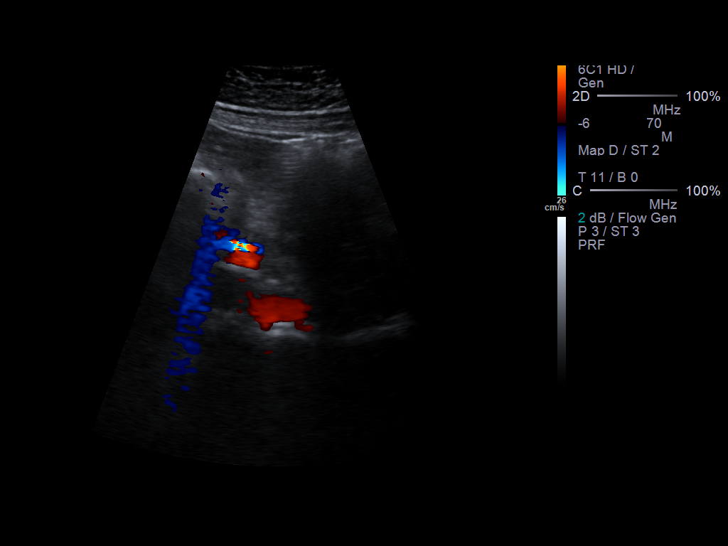

[14 of 25 positions shown; findings below may reference images not displayed]

FINDINGS: Abdominal Aorta

Maximum AP

Diameter:  5.8 cm

Maximum TRV

Diameter: 5.3 cm

Right Common Iliac Artery

1.4 cm

Left Common Iliac Artery

1.1 cm

IVC

Not visualized

Right Kidney

Length: 12.8 cm. Normal echogenicity. 4.2 cm simple cyst in the
upper pole. No hydronephrosis.

Left Kidney

Length: 9.2 cm. Normal echogenicity. 1.3 cm cyst in the lower pole.
No hydronephrosis.
IMPRESSION: Fusiform 5.8 cm abdominal aortic aneurysm.

## 2021-11-23 ENCOUNTER — Other Ambulatory Visit: Payer: Self-pay

## 2021-11-23 ENCOUNTER — Encounter: Payer: Self-pay | Admitting: Physician Assistant

## 2021-11-23 ENCOUNTER — Emergency Department: Payer: Medicare Other

## 2021-11-23 ENCOUNTER — Emergency Department
Admission: EM | Admit: 2021-11-23 | Discharge: 2021-11-23 | Disposition: A | Discharge status: Home / Self Care | Payer: Medicare Other | Attending: Emergency Medicine | Admitting: Emergency Medicine

## 2021-11-23 DIAGNOSIS — I509 Heart failure, unspecified: Secondary | ICD-10-CM | POA: Diagnosis not present

## 2021-11-23 DIAGNOSIS — N189 Chronic kidney disease, unspecified: Secondary | ICD-10-CM | POA: Diagnosis not present

## 2021-11-23 DIAGNOSIS — W19XXXA Unspecified fall, initial encounter: Secondary | ICD-10-CM | POA: Insufficient documentation

## 2021-11-23 DIAGNOSIS — M5442 Lumbago with sciatica, left side: Secondary | ICD-10-CM | POA: Diagnosis not present

## 2021-11-23 DIAGNOSIS — Z95810 Presence of automatic (implantable) cardiac defibrillator: Secondary | ICD-10-CM | POA: Diagnosis not present

## 2021-11-23 DIAGNOSIS — M545 Low back pain, unspecified: Secondary | ICD-10-CM | POA: Diagnosis present

## 2021-11-23 LAB — CBC WITH DIFFERENTIAL/PLATELET
Abs Immature Granulocytes: 0.07 10*3/uL (ref 0.00–0.07)
Basophils Absolute: 0 10*3/uL (ref 0.0–0.1)
Basophils Relative: 0 %
Eosinophils Absolute: 0.2 10*3/uL (ref 0.0–0.5)
Eosinophils Relative: 2 %
HCT: 41.4 % (ref 39.0–52.0)
Hemoglobin: 13 g/dL (ref 13.0–17.0)
Immature Granulocytes: 1 %
Lymphocytes Relative: 16 %
Lymphs Abs: 1.3 10*3/uL (ref 0.7–4.0)
MCH: 33.2 pg (ref 26.0–34.0)
MCHC: 31.4 g/dL (ref 30.0–36.0)
MCV: 105.6 fL — ABNORMAL HIGH (ref 80.0–100.0)
Monocytes Absolute: 0.7 10*3/uL (ref 0.1–1.0)
Monocytes Relative: 8 %
Neutro Abs: 5.8 10*3/uL (ref 1.7–7.7)
Neutrophils Relative %: 73 %
Platelets: 143 10*3/uL — ABNORMAL LOW (ref 150–400)
RBC: 3.92 MIL/uL — ABNORMAL LOW (ref 4.22–5.81)
RDW: 16.7 % — ABNORMAL HIGH (ref 11.5–15.5)
WBC: 8 10*3/uL (ref 4.0–10.5)
nRBC: 0 % (ref 0.0–0.2)

## 2021-11-23 LAB — URINALYSIS, ROUTINE W REFLEX MICROSCOPIC
Bacteria, UA: NONE SEEN
Bilirubin Urine: NEGATIVE
Glucose, UA: NEGATIVE mg/dL
Ketones, ur: NEGATIVE mg/dL
Leukocytes,Ua: NEGATIVE
Nitrite: NEGATIVE
Protein, ur: NEGATIVE mg/dL
Specific Gravity, Urine: 1.01 (ref 1.005–1.030)
pH: 7 (ref 5.0–8.0)

## 2021-11-23 LAB — BASIC METABOLIC PANEL
Anion gap: 5 (ref 5–15)
BUN: 19 mg/dL (ref 8–23)
CO2: 28 mmol/L (ref 22–32)
Calcium: 8.8 mg/dL — ABNORMAL LOW (ref 8.9–10.3)
Chloride: 108 mmol/L (ref 98–111)
Creatinine, Ser: 1.78 mg/dL — ABNORMAL HIGH (ref 0.61–1.24)
GFR, Estimated: 40 mL/min — ABNORMAL LOW (ref >60)
Glucose, Bld: 134 mg/dL — ABNORMAL HIGH (ref 70–99)
Potassium: 4.1 mmol/L (ref 3.5–5.1)
Sodium: 141 mmol/L (ref 135–145)

## 2021-11-23 MED ORDER — HYDROCODONE-ACETAMINOPHEN 5-325 MG PO TABS
1.0000 | ORAL_TABLET | Freq: Once | ORAL | Status: AC
  Administered 2021-11-23: 1 via ORAL
  Filled 2021-11-23: qty 1

## 2021-11-23 MED ORDER — LIDOCAINE 5 % EX PTCH
1.0000 | MEDICATED_PATCH | Freq: Once | CUTANEOUS | Status: DC
  Administered 2021-11-23: 1 via TRANSDERMAL
  Filled 2021-11-23: qty 1

## 2021-11-23 NOTE — ED Notes (Signed)
[employer] here at this time collecting statements.

## 2021-11-23 NOTE — ED Notes (Signed)
Attempted to call cab company to arrange ride- [employer] cab not operating at this time. Consulting civil engineer and EDP aware- state he will have to wait in lobby for transportation.

## 2021-11-23 NOTE — ED Notes (Signed)
Provider in pt's room explaining that he is medically cleared for discharge.

## 2021-11-23 NOTE — ED Notes (Signed)
Called pt's son to request ride home at this time- son Jeannett Senior states he cannot drive at night and pt has his truck.

## 2021-11-23 NOTE — ED Notes (Signed)
[employer], charge RN, provider, and nursing [facility] at bedside explaining options to pt.

## 2021-11-23 NOTE — ED Notes (Signed)
Security at bedside informing pt he must go to lobby. Pt lounging in bed, skin warm, dry, respirations even and unlabored, in NAD, eating snacks- states he is "just in too much pain to get up or sit in wheelchair." Pt states if anyone touches him he will sue [place]. EDP and charge RN informed.

## 2021-11-23 NOTE — ED Triage Notes (Signed)
Pt arrives with c/o lower back pain that started a couple of weeks ago. Pt had a fall a couple of weeks ago and thinks the pain is from then.

## 2021-11-23 NOTE — Discharge Instructions (Signed)
Your exam, labs, and XR are normal following your fall. Take your home meds as previously prescribed. Follow-up with primary provider for any medication changes.

## 2021-11-23 NOTE — ED Notes (Signed)
Pt informed that he must wait in lobby for ride/walk home because cab is not in service now or over the weekend. Pt refusing to leave ED, states "I'm sorry but I'm not going to sit out there...you're gonna have to call security." Charge RN informed and security called to escort pt out. Pt requesting voucher- once again informed pt the cab service is not available and the ED does not arrange transportation unless medically necessary. Pt requesting police take him home- informed pt there is a courtesy phone in the lobby if he wants to make a call, but police will not be contacted for him.

## 2021-11-23 NOTE — ED Notes (Addendum)
Pt states he could not get a hold of ride and requests this RN drive him home. Pt informed staff does not transport pt's home. Pt is requesting a meal, pain medication, and a voucher for a free cab ride. EDP notified.

## 2021-11-23 NOTE — ED Notes (Signed)
This RN, Maisie Fus, [facility], security, and [employer] at bedside.  Pt informed again that he has been medically cleared and discharged and will need to wait in the lobby for a ride.  Patient refusing to get out of bed or leave, demanding more medication, demanding a cab voucher, and continues to state he will wait in the treatment room on the stretcher until the morning.  Cab voucher has been offered to the patient but informed multiple times that the cab company does not operate at night.  Pt has been offered to wait in the lobby and use the cab voucher in the morning when running.  Pt continued to refuse at first and threatening to sue if touched by staff or [employer] but then was assisted to to wheelchair by [employer] and also given a copy of cab voucher for use in the morning.  Pt wheeled to lobby by police.

## 2021-11-23 NOTE — ED Provider Notes (Signed)
[facility] Emergency Department Provider Note     Event Date/Time   First MD Initiated Contact with Patient 11/23/21 1620     (approximate)   History   Back Pain   HPI  Jeremy Aguilar is a 71 y.o. male with history of hyperglycemia, CKD, CHF, and implantable defibrillator presents to the ED for evaluation following mechanical fall.  Was apparently evaluated 3 days ago on 825, [facility] hospitals after a fall.  Chart review confirms imaging including head neck CT and a chest x-ray at that time.  Patient is also given a refill of his Tylenol 3 by his PCP on that same date.  Patient reports ongoing low back pain following a fall 3 weeks ago.  He presents to the ED via EMS from home.  Patient denies any bladder or bowel incontinence, foot drop, saddle anesthesia.   Physical Exam   Triage Vital Signs: ED Triage Vitals  Enc Vitals Group     BP 11/23/21 1551 120/61     Pulse Rate 11/23/21 1551 65     Resp 11/23/21 1551 20     Temp 11/23/21 1551 98.1 F (36.7 C)     Temp Source 11/23/21 1551 Oral     SpO2 11/23/21 1551 95 %     Weight 11/23/21 1552 247 lb (112 kg)     Height --      Head Circumference --      Peak Flow --      Pain Score --      Pain Loc --      Pain Edu? --      Excl. in GC? --     Most recent vital signs: Vitals:   11/23/21 1551 11/23/21 1923  BP: 120/61 121/68  Pulse: 65 65  Resp: 20 18  Temp: 98.1 F (36.7 C)   SpO2: 95% 97%    General Awake, no distress. NAD HEENT NCAT. PERRL. EOMI. No rhinorrhea. Mucous membranes are moist.  CV:  Good peripheral perfusion.  RESP:  Normal effort.  ABD:  No distention.  MSK:  Normal spinal alignment without midline tenderness, spasm, vomiting, or step-off.  Patient transitions from supine to sit without assistance.  Full active range of motion of lower extremities bilaterally. NEURO: Cranial nerves II through XII grossly intact.  LE DTRs bilaterally.  Normal toe dorsiflexion and foot  eversion on exam.  Negative seated straight leg raise bilaterally.   ED Results / Procedures / Treatments   Labs (all labs ordered are listed, but only abnormal results are displayed) Labs Reviewed  CBC WITH DIFFERENTIAL/PLATELET - Abnormal; Notable for the following components:      Result Value   RBC 3.92 (*)    MCV 105.6 (*)    RDW 16.7 (*)    Platelets 143 (*)    All other components within normal limits  BASIC METABOLIC PANEL - Abnormal; Notable for the following components:   Glucose, Bld 134 (*)    Creatinine, Ser 1.78 (*)    Calcium 8.8 (*)    GFR, Estimated 40 (*)    All other components within normal limits  URINALYSIS, ROUTINE W REFLEX MICROSCOPIC - Abnormal; Notable for the following components:   Color, Urine YELLOW (*)    APPearance HAZY (*)    Hgb urine dipstick SMALL (*)    All other components within normal limits    EKG   RADIOLOGY  I personally viewed and evaluated these images as part of  my medical decision making, as well as reviewing the written report by the radiologist.  ED Provider Interpretation: no acute findings  DG Lumbar Spine Complete  Result Date: 11/23/2021 CLINICAL DATA:  Left-sided low back pain beginning a couple weeks ago after fall. Right lower extremity radiculopathy. EXAM: LUMBAR SPINE - COMPLETE 4+ VIEW COMPARISON:  None Available. FINDINGS: Subtle curvature of the lumbar spine convex left. Subtle grade 1 anterolisthesis of L4 on L5 likely due to the associated facet arthropathy. No evidence of compression fracture. Mild spondylosis throughout the lumbar spine. Minimal disc space narrowing at the L2-3, L3-4 L4-5 levels. Aortoiliac stent graft is present. Bilateral renal artery stents are present. Suggestion of stent in the region of the celiac and superior mesenteric arteries. IMPRESSION: 1. No acute findings. 2. Mild spondylosis of the lumbar spine with mild multilevel disc disease as described. Subtle grade 1 anterolisthesis of L4 on L5  likely due to the associated facet arthropathy. Electronically Signed   By: Elberta Fortis M.D.   On: 11/23/2021 18:24     PROCEDURES:  Critical Care performed: No  Procedures   MEDICATIONS ORDERED IN ED: Medications  HYDROcodone-acetaminophen (NORCO/VICODIN) 5-325 MG per tablet 1 tablet (has no administration in time range)  lidocaine (LIDODERM) 5 % 1 patch (has no administration in time range)     IMPRESSION / MDM / ASSESSMENT AND PLAN / ED COURSE  I reviewed the triage vital signs and the nursing notes.                              Differential diagnosis includes, but is not limited to, lumbar strain, lumbar radiculopathy, lumbar contusion, lumbar fracture, UTI  Patient's presentation is most consistent with acute complicated illness / injury requiring diagnostic workup.  Patient to the ED for evaluation of acute left-sided low back pain following mechanical fall 3 weeks ago.  Patient presents in no acute distress with reports of ongoing pain despite prescription pain medicine.  Patient exam is otherwise benign and reassuring with no red flags on exam.  No acute lab abnormalities noted.  No x-ray evidence of any acute fracture or dislocation.  Patient's diagnosis is consistent with lumbar strain and left-sided radicular pain. Patient will be discharged home with instructions to take his home medications. Patient is to follow up with PCP as needed or otherwise directed. Patient is given ED precautions to return to the ED for any worsening or new symptoms.     FINAL CLINICAL IMPRESSION(S) / ED DIAGNOSES   Final diagnoses:  Acute left-sided low back pain with left-sided sciatica     Rx / DC Orders   ED Discharge Orders     None        Note:  This document was prepared using Dragon voice recognition software and may include unintentional dictation errors.    Lissa Hoard, PA-C 11/23/21 1926    Chesley Noon, MD 11/23/21 [phone]

## 2021-11-23 NOTE — ED Notes (Addendum)
Meal provided to pt per request by flex RN. Pt requesting cigar.

## 2021-11-23 NOTE — ED Notes (Signed)
Pt taken to lobby by [employer] at this time via wheelchair. Voucher to be provided to pt by charge RN for morning.

## 2023-10-17 DEATH — deceased
